# Patient Record
Sex: Female | Born: 1995 | Race: White | Hispanic: No | Marital: Single | Smoking: Former smoker
Health system: Southern US, Community
[De-identification: ages and names within clinical notes are randomized; demographics above are authoritative.]

---
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
## 2018-09-09 ENCOUNTER — Ambulatory Visit (HOSPITAL_COMMUNITY)
Admission: EM | Admit: 2018-09-09 | Discharge: 2018-09-09 | Disposition: A | Discharge status: Home / Self Care | Payer: BLUE CROSS/BLUE SHIELD | Attending: Family Medicine | Admitting: Family Medicine

## 2018-09-09 ENCOUNTER — Encounter (HOSPITAL_COMMUNITY): Payer: Self-pay | Admitting: Emergency Medicine

## 2018-09-09 DIAGNOSIS — M25571 Pain in right ankle and joints of right foot: Secondary | ICD-10-CM | POA: Diagnosis not present

## 2018-09-09 MED ORDER — PREDNISONE 50 MG PO TABS: 50.0000 mg | ORAL_TABLET | Freq: Every day | ORAL | 0 refills | Status: AC

## 2018-09-09 MED ORDER — NAPROXEN 500 MG PO TABS: 500.0000 mg | ORAL_TABLET | Freq: Two times a day (BID) | ORAL | 0 refills | Status: AC

## 2018-09-09 NOTE — Discharge Instructions (Signed)
Begin prednisone daily with food for the next 5 days, take in the morning if you are able After completion of prednisone take Naprosyn twice daily as needed for joint aches and pains Ice and elevate ankle when swollen  Follow-up with primary care and rheumatology for further evaluation of your persistent joint pains.

## 2018-09-09 NOTE — ED Provider Notes (Signed)
[facility]    CSN: [id number] Arrival date & time: 09/09/18  1338     History   Chief Complaint Chief Complaint  Patient presents with  . Ankle Pain  . Knee Pain    HPI Andrea Buchanan is a 23 y.o. female no significant past medical history presenting today for evaluation of right ankle pain.  Patient states that over the past 6 months she has had stiffness and arthralgias in her shoulders as well as upper spine/neck.  More recently she has developed discomfort in her right knee as well as right ankle.  Her ankle has been the most problematic as she has a lot of pain with weightbearing.  Denies any injury, denies previous injury.  She notes that her uncle has psoriatic arthritis.  Denies any immediate family members with any type of arthritis or other autoimmune disorder.  She does feel the stiffness is worse first thing in the morning, and improves with movement.  She has been taking ibuprofen and Arthriten without relief.  Denies numbness or tingling.  Denies change in color or redness.  HPI  History reviewed. No pertinent past medical history.  There are no active problems to display for this patient.   History reviewed. No pertinent surgical history.  OB History   No obstetric history on file.      Home Medications    Prior to Admission medications   Medication Sig Start Date End Date Taking? Authorizing Provider  naproxen (NAPROSYN) 500 MG tablet Take 1 tablet (500 mg total) by mouth 2 (two) times daily. 09/09/18   Wieters, Hallie C, PA-C  predniSONE (DELTASONE) 50 MG tablet Take 1 tablet (50 mg total) by mouth daily for 5 days. 09/09/18 09/14/18  Wieters, Junius Creamer, PA-C    Family History Family History  Problem Relation Age of Onset  . Arthritis Maternal Uncle     Social History Social History   Tobacco Use  . Smoking status: Never Smoker  Substance Use Topics  . Alcohol use: Yes  . Drug use: Not on file     Allergies   Patient has no known  allergies.   Review of Systems Review of Systems  Constitutional: Negative for fatigue and fever.  Eyes: Negative for visual disturbance.  Respiratory: Negative for shortness of breath.   Cardiovascular: Negative for chest pain.  Gastrointestinal: Negative for abdominal pain, nausea and vomiting.  Musculoskeletal: Positive for arthralgias, gait problem and joint swelling.  Skin: Negative for color change, rash and wound.  Neurological: Negative for dizziness, weakness, light-headedness and headaches.     Physical Exam Triage Vital Signs ED Triage Vitals  Enc Vitals Group     BP 09/09/18 1415 (!) 157/100     Pulse Rate 09/09/18 1415 93     Resp 09/09/18 1415 16     Temp 09/09/18 1415 98.6 F (37 C)     Temp src --      SpO2 09/09/18 1415 100 %     Weight --      Height --      Head Circumference --      Peak Flow --      Pain Score 09/09/18 1416 4     Pain Loc --      Pain Edu? --      Excl. in GC? --    No data found.  Updated Vital Signs BP (!) 157/100   Pulse 93   Temp 98.6 F (37 C)   Resp 16  LMP 09/09/2018   SpO2 100%   Visual Acuity Right Eye Distance:   Left Eye Distance:   Bilateral Distance:    Right Eye Near:   Left Eye Near:    Bilateral Near:     Physical Exam Vitals signs and nursing note reviewed.  Constitutional:      Appearance: She is well-developed.     Comments: No acute distress  HENT:     Head: Normocephalic and atraumatic.     Nose: Nose normal.  Eyes:     Conjunctiva/sclera: Conjunctivae normal.  Neck:     Musculoskeletal: Neck supple.  Cardiovascular:     Rate and Rhythm: Normal rate.  Pulmonary:     Effort: Pulmonary effort is normal. No respiratory distress.  Abdominal:     General: There is no distension.  Musculoskeletal: Normal range of motion.     Comments: Right ankle: Mild swelling compared to left, no overlying erythema or discoloration, nontender to palpation about medial and lateral malleolus, Achilles as  well as throughout anterior aspect of ankle and to dorsum of foot, dorsalis pedis 2+, cap refill less than 2 seconds, sensation intact distally, full active range of motion of the ankle  Right knee: No overlying swelling discoloration, nontender over patella and medial lateral joint lines, nontender over tibial tubercle, full active range of motion of knee  Skin:    General: Skin is warm and dry.  Neurological:     Mental Status: She is alert and oriented to person, place, and time.      UC Treatments / Results  Labs (all labs ordered are listed, but only abnormal results are displayed) Labs Reviewed - No data to display  EKG None  Radiology No results found.  Procedures Procedures (including critical care time)  Medications Ordered in UC Medications - No data to display  Initial Impression / Assessment and Plan / UC Course  I have reviewed the triage vital signs and the nursing notes.  Pertinent labs & imaging results that were available during my care of the patient were reviewed by me and considered in my medical decision making (see chart for details).     Patient with arthralgias, given age and symptoms concerning for possible underlying autoimmune cause.  Recommending follow-up with rheumatology.  For today we will treat with 5 days of prednisone, followed by NSAIDs after completion of prednisone.  Ice and elevation.  Continue to wear ASO brace.Discussed strict return precautions. Patient verbalized understanding and is agreeable with plan.  Final Clinical Impressions(s) / UC Diagnoses   Final diagnoses:  Acute right ankle pain     Discharge Instructions     Begin prednisone daily with food for the next 5 days, take in the morning if you are able After completion of prednisone take Naprosyn twice daily as needed for joint aches and pains Ice and elevate ankle when swollen  Follow-up with primary care and rheumatology for further evaluation of your persistent  joint pains.   ED Prescriptions    Medication Sig Dispense Auth. Provider   predniSONE (DELTASONE) 50 MG tablet Take 1 tablet (50 mg total) by mouth daily for 5 days. 5 tablet Wieters, Hallie C, PA-C   naproxen (NAPROSYN) 500 MG tablet Take 1 tablet (500 mg total) by mouth 2 (two) times daily. 30 tablet Wieters, Parnell C, PA-C     Controlled Substance Prescriptions [place] Controlled Substance Registry consulted? Not Applicable   Lew Dawes, [place] 09/09/18 1738

## 2018-09-09 NOTE — ED Triage Notes (Signed)
Pt states shes had chronic joint pain in her knee and ankles since dec of last year.

## 2018-11-11 ENCOUNTER — Encounter: Payer: Self-pay | Admitting: Internal Medicine

## 2018-11-11 ENCOUNTER — Other Ambulatory Visit: Payer: Self-pay

## 2018-11-11 ENCOUNTER — Ambulatory Visit (INDEPENDENT_AMBULATORY_CARE_PROVIDER_SITE_OTHER): Payer: BC Managed Care – PPO | Admitting: Internal Medicine

## 2018-11-11 DIAGNOSIS — M2550 Pain in unspecified joint: Secondary | ICD-10-CM

## 2018-11-11 NOTE — Progress Notes (Signed)
Virtual Visit via Telephone Note Due to current restrictions/limitations of in-office visits due to the COVID-19 pandemic, this scheduled clinical appointment was converted to a telehealth visit  I connected with Andrea Buchanan on 11/11/18 at 3:35 p.m by telephone and verified that I am speaking with the correct person using two identifiers. I am in my office.  The patient is at home.  Only the patient and myself participated in this encounter.   I discussed the limitations, risks, security and privacy concerns of performing an evaluation and management service by telephone and the availability of in person appointments. I also discussed with the patient that there may be a patient responsible charge related to this service. The patient expressed understanding and agreed to proceed.   History of Present Illness: Pt new to this practice.  Previous PCP was at [facility].  Last seen 8 yrs ago.  No chronic issues   C/o persistent jt pains in past few mths Jts include wrists, RT ankle and RT shoulder Notice swelling in RT ankle, not sure about the wrists + morning stiffness that last for about 1/2 hr.  Better once she gets moving.  No fever, wgh loss or unexplained rash. Hx of psoriatic arthritis in uncle and arthritis in grandparents. Seen at [facility] in May.  Placed on short course of Prednisone which she did not find helpful.  Also given Naprosyn which did not seem to help at first but now it takes.  She takes one every day for past several days.   Social History   Socioeconomic History  . Marital status: Single    Spouse name: Not on file  . Number of children: 0  . Years of education: Not on file  . Highest education level: Not on file  Occupational History  . Not on file  Social Needs  . Financial resource strain: Not on file  . Food insecurity    Worry: Not on file    Inability: Not on file  . Transportation needs    Medical: Not on file    Non-medical: Not on file   Tobacco Use  . Smoking status: Former Smoker  Substance and Sexual Activity  . Alcohol use: Yes  . Drug use: Yes    Types: Marijuana    Comment: occasionally  . Sexual activity: Not on file  Lifestyle  . Physical activity    Days per week: Not on file    Minutes per session: Not on file  . Stress: Not on file  Relationships  . Social Herbalist on phone: Not on file    Gets together: Not on file    Attends religious service: Not on file    Active member of club or organization: Not on file    Attends meetings of clubs or organizations: Not on file    Relationship status: Not on file  . Intimate partner violence    Fear of current or ex partner: Not on file    Emotionally abused: Not on file    Physically abused: Not on file    Forced sexual activity: Not on file  Other Topics Concern  . Not on file  Social History Narrative  . Not on file   Active Ambulatory Problems    Diagnosis Date Noted  . No Active Ambulatory Problems   Resolved Ambulatory Problems    Diagnosis Date Noted  . No Resolved Ambulatory Problems   No Additional Past Medical History   Patient has  a past family history of psoriasis headache arthritis paternal uncle and arthritis in her grandparents.  Past surgical history none  Observations/Objective: No direct observation done as this was a telephone encounter  Assessment and Plan: 1. Polyarthralgia -Differential diagnoses include OA or inflammatory arthritis.  However I think inflammatory arthritis given morning stiffness lasting only 30 minutes.  I recommend in person evaluation and then we can decide whether blood tests, imaging and referral to rheumatologist as needed.  Patient is agreeable to the plan.   Follow Up Instructions: 1 to 2 weeks in person   I discussed the assessment and treatment plan with the patient. The patient was provided an opportunity to ask questions and all were answered. The patient agreed with the plan and  demonstrated an understanding of the instructions.   The patient was advised to call back or seek an in-person evaluation if the symptoms worsen or if the condition fails to improve as anticipated.  I provided 14 minutes of non-face-to-face time during this encounter.   Jonah Blue, MD

## 2018-11-12 ENCOUNTER — Inpatient Hospital Stay: Payer: BLUE CROSS/BLUE SHIELD | Admitting: Nurse Practitioner

## 2018-11-17 ENCOUNTER — Telehealth: Payer: Self-pay

## 2018-11-17 NOTE — Telephone Encounter (Signed)
Called patient to do their pre-visit COVID screening.  Call went to voicemail. Unable to do prescreening.  

## 2018-11-18 ENCOUNTER — Other Ambulatory Visit: Payer: BC Managed Care – PPO

## 2018-11-18 ENCOUNTER — Other Ambulatory Visit: Payer: Self-pay

## 2018-11-18 ENCOUNTER — Ambulatory Visit (INDEPENDENT_AMBULATORY_CARE_PROVIDER_SITE_OTHER): Payer: BC Managed Care – PPO | Admitting: Internal Medicine

## 2018-11-18 ENCOUNTER — Ambulatory Visit (INDEPENDENT_AMBULATORY_CARE_PROVIDER_SITE_OTHER): Payer: BC Managed Care – PPO

## 2018-11-18 ENCOUNTER — Encounter: Payer: Self-pay | Admitting: Internal Medicine

## 2018-11-18 VITALS — BP 117/74 | HR 103 | Temp 97.5°F | Resp 17 | Ht 66.0 in | Wt 255.2 lb

## 2018-11-18 DIAGNOSIS — M79642 Pain in left hand: Secondary | ICD-10-CM

## 2018-11-18 DIAGNOSIS — M25532 Pain in left wrist: Secondary | ICD-10-CM

## 2018-11-18 DIAGNOSIS — M2550 Pain in unspecified joint: Secondary | ICD-10-CM

## 2018-11-18 DIAGNOSIS — M7989 Other specified soft tissue disorders: Secondary | ICD-10-CM | POA: Diagnosis not present

## 2018-11-18 DIAGNOSIS — R2241 Localized swelling, mass and lump, right lower limb: Secondary | ICD-10-CM

## 2018-11-18 DIAGNOSIS — M25531 Pain in right wrist: Secondary | ICD-10-CM | POA: Diagnosis not present

## 2018-11-18 DIAGNOSIS — M79641 Pain in right hand: Secondary | ICD-10-CM

## 2018-11-18 DIAGNOSIS — M25571 Pain in right ankle and joints of right foot: Secondary | ICD-10-CM

## 2018-11-18 NOTE — Progress Notes (Signed)
Patient ID: Andrea Buchanan, female    DOB: 03-Oct-1995  MRN: [id number]  CC: Knee Pain and Ankle Pain   Subjective: Andrea Buchanan is a 23 y.o. female who presents for in person evaluation of joint pains Her concerns today include:   I did a telephone visit with this patient last week.  On that visit planes of the following:  c/o persistent jt pains in past few mths Jts include wrists, RT ankle and RT shoulder Notice swelling in RT ankle, not sure about the wrists + morning stiffness that last for about 1/2 hr.  Better once she gets moving.  No fever, wgh loss or unexplained rash. Hx of psoriatic arthritis in uncle and arthritis in grandparents. Seen at [facility] in May.  Placed on short course of Prednisone which she did not find helpful.  Also given Naprosyn which did not seem to help at first but now it takes.  She takes one every day for past several days.   Today:  Wrists are still bothersome however they do not swell.  She also reports pain and stiffness over the knuckles.. RT ankle stays swollen.  No known injury to the ankle.  She has to limp sometimes he walks  RT shoulder pain more recent.  Can not sleep on it.  Hard to reach behind the back and over the head.  Still takes Naprosyn as needed.   Patient Active Problem List   Diagnosis Date Noted  . Polyarthralgia 11/11/2018     Current Outpatient Medications on File Prior to Visit  Medication Sig Dispense Refill  . naproxen (NAPROSYN) 500 MG tablet Take 1 tablet (500 mg total) by mouth 2 (two) times daily. 30 tablet 0   No current facility-administered medications on file prior to visit.     No Known Allergies  Social History   Socioeconomic History  . Marital status: Single    Spouse name: Not on file  . Number of children: 0  . Years of education: Not on file  . Highest education level: Not on file  Occupational History  . Not on file  Social Needs  . Financial resource strain: Not on file  . Food  insecurity    Worry: Not on file    Inability: Not on file  . Transportation needs    Medical: Not on file    Non-medical: Not on file  Tobacco Use  . Smoking status: Former Research scientist (life sciences)  . Smokeless tobacco: Never Used  Substance and Sexual Activity  . Alcohol use: Yes  . Drug use: Yes    Types: Marijuana    Comment: occasionally  . Sexual activity: Not on file  Lifestyle  . Physical activity    Days per week: Not on file    Minutes per session: Not on file  . Stress: Not on file  Relationships  . Social Herbalist on phone: Not on file    Gets together: Not on file    Attends religious service: Not on file    Active member of club or organization: Not on file    Attends meetings of clubs or organizations: Not on file    Relationship status: Not on file  . Intimate partner violence    Fear of current or ex partner: Not on file    Emotionally abused: Not on file    Physically abused: Not on file    Forced sexual activity: Not on file  Other Topics Concern  . Not  on file  Social History Narrative  . Not on file    Family History  Problem Relation Age of Onset  . Arthritis Maternal Uncle   . Diabetes Maternal Uncle   . Arthritis Maternal Grandmother   . Diabetes Maternal Grandmother   . Hyperlipidemia Maternal Grandmother   . Diabetes Maternal Grandfather   . Hyperlipidemia Maternal Grandfather     No past surgical history on file.  ROS: Review of Systems Negative except as stated above  PHYSICAL EXAM: BP 117/74   Pulse (!) 103   Temp (!) 97.5 F (36.4 C) (Temporal)   Resp 17   Ht 5\' 6"  (1.676 m)   Wt 255 lb 3.2 oz (115.8 kg)   LMP 11/04/2018 (Approximate)   SpO2 98%   BMI 41.19 kg/m   Physical Exam  General appearance - alert, well appearing, young Caucasian female and in no distress Neck - supple, no significant adenopathy Chest - clear to auscultation, no wheezes, rales or rhonchi, symmetric air entry Heart - normal rate, regular rhythm,  normal S1, S2, no murmurs, rubs, clicks or gallops Musculoskeletal -right ankle: Mild edema.  No erythema.  Slightly warm to touch.  Good flexion extension.  Some discomfort with rotation.  No tenderness on palpation of the Achilles tendon. Hands: Mild pitting of the nailbeds on the right hand.  No signs of visible inspection of the PIP DIP and MCP joints.  However she has an excellent palpation over the MCP joints and discomfort with movement of these joints.  Mild edema of the wrists with discomfort with passive movement in all direction. Right shoulder: Tenderness discomfort with passive movement Cross the body over the head and behind the back Skin: Moderate inflammatory acne over the chin.  No rash seen over the cheeks.  ASSESSMENT AND PLAN: 1. Polyarthralgia -Patient reports morning stiffness that lasts 30 minutes.  This is not consistent with inflammatory arthritis however she does have involvement of proximal joints in the hands, involvement of the wrists and ankles that may suggest inflammatory arthritis.  We will do some baseline blood tests.  X-rays of some of the joints.  Refer to rheumatology for further evaluation.  Patient feels she is doing okay with just the Naprosyn so she will continue that for now as needed. - Rheumatoid factor - ANA w/Reflex if Positive - Sedimentation rate - Ambulatory referral to Rheumatology - DG Ankle Complete Right; Future - DG Hand Complete Left; Future - DG Hand Complete Right; Future - CYCLIC CITRUL PEPTIDE ANTIBODY, IGG/IGA     Patient was given the opportunity to ask questions.  Patient verbalized understanding of the plan and was able to repeat key elements of the plan.   No orders of the defined types were placed in this encounter.    Requested Prescriptions    No prescriptions requested or ordered in this encounter    No follow-ups on file.  11/06/2018, MD, FACP

## 2018-11-18 NOTE — Progress Notes (Signed)
Knee pain has mostly resolved. R ankle pain is still constant. Is walking with a limp. [place] moving [place] campus today & [place].

## 2018-11-19 ENCOUNTER — Telehealth: Payer: Self-pay | Admitting: Internal Medicine

## 2018-11-19 NOTE — Telephone Encounter (Signed)
Phone call placed to patient today.  I left a message on her voicemail letting her know that the x-rays of the hands and right ankle show findings that suggest that she may have some form of inflammatory arthritis like rheumatoid arthritis or juvenile rheumatoid arthritis.  I await the results of lab tests and will let her know the results when they are available.  We have already submitted a referral for her to see the rheumatologist.

## 2018-11-19 NOTE — Progress Notes (Unsigned)
This note was opened in error.

## 2018-11-20 LAB — RHEUMATOID FACTOR: Rhuematoid fact SerPl-aCnc: 198.3 IU/mL — ABNORMAL HIGH (ref 0.0–13.9)

## 2018-11-20 LAB — SEDIMENTATION RATE: Sed Rate: 44 mm/hr — ABNORMAL HIGH (ref 0–32)

## 2018-11-20 LAB — CYCLIC CITRUL PEPTIDE ANTIBODY, IGG/IGA: Cyclic Citrullin Peptide Ab: >250 units — ABNORMAL HIGH (ref 0–19)

## 2018-11-20 LAB — ANA W/REFLEX IF POSITIVE: Anti Nuclear Antibody (ANA): NEGATIVE

## 2018-11-20 NOTE — Progress Notes (Signed)
Patient notified of results & recommendations. Expressed understanding.

## 2018-12-16 DIAGNOSIS — M0579 Rheumatoid arthritis with rheumatoid factor of multiple sites without organ or systems involvement: Secondary | ICD-10-CM | POA: Diagnosis not present

## 2018-12-16 DIAGNOSIS — Z79899 Other long term (current) drug therapy: Secondary | ICD-10-CM | POA: Diagnosis not present

## 2018-12-16 DIAGNOSIS — Z6839 Body mass index (BMI) 39.0-39.9, adult: Secondary | ICD-10-CM | POA: Diagnosis not present

## 2018-12-16 DIAGNOSIS — R5382 Chronic fatigue, unspecified: Secondary | ICD-10-CM | POA: Diagnosis not present

## 2018-12-16 DIAGNOSIS — M2550 Pain in unspecified joint: Secondary | ICD-10-CM | POA: Diagnosis not present

## 2018-12-30 DIAGNOSIS — M2550 Pain in unspecified joint: Secondary | ICD-10-CM | POA: Diagnosis not present

## 2018-12-30 DIAGNOSIS — M0579 Rheumatoid arthritis with rheumatoid factor of multiple sites without organ or systems involvement: Secondary | ICD-10-CM | POA: Diagnosis not present

## 2018-12-30 DIAGNOSIS — R5382 Chronic fatigue, unspecified: Secondary | ICD-10-CM | POA: Diagnosis not present

## 2019-03-03 DIAGNOSIS — M0579 Rheumatoid arthritis with rheumatoid factor of multiple sites without organ or systems involvement: Secondary | ICD-10-CM | POA: Diagnosis not present

## 2019-03-03 DIAGNOSIS — M2550 Pain in unspecified joint: Secondary | ICD-10-CM | POA: Diagnosis not present

## 2019-03-03 DIAGNOSIS — R5382 Chronic fatigue, unspecified: Secondary | ICD-10-CM | POA: Diagnosis not present

## 2019-04-07 DIAGNOSIS — M0579 Rheumatoid arthritis with rheumatoid factor of multiple sites without organ or systems involvement: Secondary | ICD-10-CM | POA: Diagnosis not present

## 2019-04-07 DIAGNOSIS — M25561 Pain in right knee: Secondary | ICD-10-CM | POA: Diagnosis not present

## 2019-04-07 DIAGNOSIS — E669 Obesity, unspecified: Secondary | ICD-10-CM | POA: Diagnosis not present

## 2019-04-07 DIAGNOSIS — M2550 Pain in unspecified joint: Secondary | ICD-10-CM | POA: Diagnosis not present

## 2019-04-07 DIAGNOSIS — R5382 Chronic fatigue, unspecified: Secondary | ICD-10-CM | POA: Diagnosis not present

## 2019-05-19 DIAGNOSIS — M0579 Rheumatoid arthritis with rheumatoid factor of multiple sites without organ or systems involvement: Secondary | ICD-10-CM | POA: Diagnosis not present

## 2019-05-19 DIAGNOSIS — M2550 Pain in unspecified joint: Secondary | ICD-10-CM | POA: Diagnosis not present

## 2019-05-19 DIAGNOSIS — R5382 Chronic fatigue, unspecified: Secondary | ICD-10-CM | POA: Diagnosis not present

## 2019-06-30 DIAGNOSIS — M25561 Pain in right knee: Secondary | ICD-10-CM | POA: Diagnosis not present

## 2019-06-30 DIAGNOSIS — R5382 Chronic fatigue, unspecified: Secondary | ICD-10-CM | POA: Diagnosis not present

## 2019-06-30 DIAGNOSIS — M0579 Rheumatoid arthritis with rheumatoid factor of multiple sites without organ or systems involvement: Secondary | ICD-10-CM | POA: Diagnosis not present

## 2019-06-30 DIAGNOSIS — M2550 Pain in unspecified joint: Secondary | ICD-10-CM | POA: Diagnosis not present

## 2019-08-18 DIAGNOSIS — M25561 Pain in right knee: Secondary | ICD-10-CM | POA: Diagnosis not present

## 2019-08-18 DIAGNOSIS — R5382 Chronic fatigue, unspecified: Secondary | ICD-10-CM | POA: Diagnosis not present

## 2019-08-18 DIAGNOSIS — M0579 Rheumatoid arthritis with rheumatoid factor of multiple sites without organ or systems involvement: Secondary | ICD-10-CM | POA: Diagnosis not present

## 2019-08-18 DIAGNOSIS — M2550 Pain in unspecified joint: Secondary | ICD-10-CM | POA: Diagnosis not present

## 2019-11-19 DIAGNOSIS — R5382 Chronic fatigue, unspecified: Secondary | ICD-10-CM | POA: Diagnosis not present

## 2019-11-19 DIAGNOSIS — M0579 Rheumatoid arthritis with rheumatoid factor of multiple sites without organ or systems involvement: Secondary | ICD-10-CM | POA: Diagnosis not present

## 2019-11-19 DIAGNOSIS — M2550 Pain in unspecified joint: Secondary | ICD-10-CM | POA: Diagnosis not present

## 2020-02-23 DIAGNOSIS — R5382 Chronic fatigue, unspecified: Secondary | ICD-10-CM | POA: Diagnosis not present

## 2020-02-23 DIAGNOSIS — Z111 Encounter for screening for respiratory tuberculosis: Secondary | ICD-10-CM | POA: Diagnosis not present

## 2020-02-23 DIAGNOSIS — M0579 Rheumatoid arthritis with rheumatoid factor of multiple sites without organ or systems involvement: Secondary | ICD-10-CM | POA: Diagnosis not present

## 2020-02-23 DIAGNOSIS — M2550 Pain in unspecified joint: Secondary | ICD-10-CM | POA: Diagnosis not present

## 2020-02-23 DIAGNOSIS — Z79899 Other long term (current) drug therapy: Secondary | ICD-10-CM | POA: Diagnosis not present

## 2020-04-28 DIAGNOSIS — Z1152 Encounter for screening for COVID-19: Secondary | ICD-10-CM | POA: Diagnosis not present

## 2020-05-26 DIAGNOSIS — M0579 Rheumatoid arthritis with rheumatoid factor of multiple sites without organ or systems involvement: Secondary | ICD-10-CM | POA: Diagnosis not present

## 2020-05-26 DIAGNOSIS — M79641 Pain in right hand: Secondary | ICD-10-CM | POA: Diagnosis not present

## 2020-05-26 DIAGNOSIS — M2550 Pain in unspecified joint: Secondary | ICD-10-CM | POA: Diagnosis not present

## 2020-05-26 DIAGNOSIS — M25531 Pain in right wrist: Secondary | ICD-10-CM | POA: Diagnosis not present

## 2020-05-26 DIAGNOSIS — M25532 Pain in left wrist: Secondary | ICD-10-CM | POA: Diagnosis not present

## 2020-05-26 DIAGNOSIS — M79642 Pain in left hand: Secondary | ICD-10-CM | POA: Diagnosis not present

## 2020-08-23 DIAGNOSIS — M2550 Pain in unspecified joint: Secondary | ICD-10-CM | POA: Diagnosis not present

## 2020-08-23 DIAGNOSIS — M0579 Rheumatoid arthritis with rheumatoid factor of multiple sites without organ or systems involvement: Secondary | ICD-10-CM | POA: Diagnosis not present

## 2020-08-23 DIAGNOSIS — M79642 Pain in left hand: Secondary | ICD-10-CM | POA: Diagnosis not present

## 2020-08-23 DIAGNOSIS — M79641 Pain in right hand: Secondary | ICD-10-CM | POA: Diagnosis not present

## 2020-08-23 IMAGING — DX RIGHT ANKLE - COMPLETE 3+ VIEW
3 series · 3 of 3 positions shown · non-contrast
Comparison: None.

CLINICAL DATA: Chronic pain and swelling of the right ankle

EXAM:
RIGHT ANKLE - COMPLETE 3+ VIEW

[ankle ap]
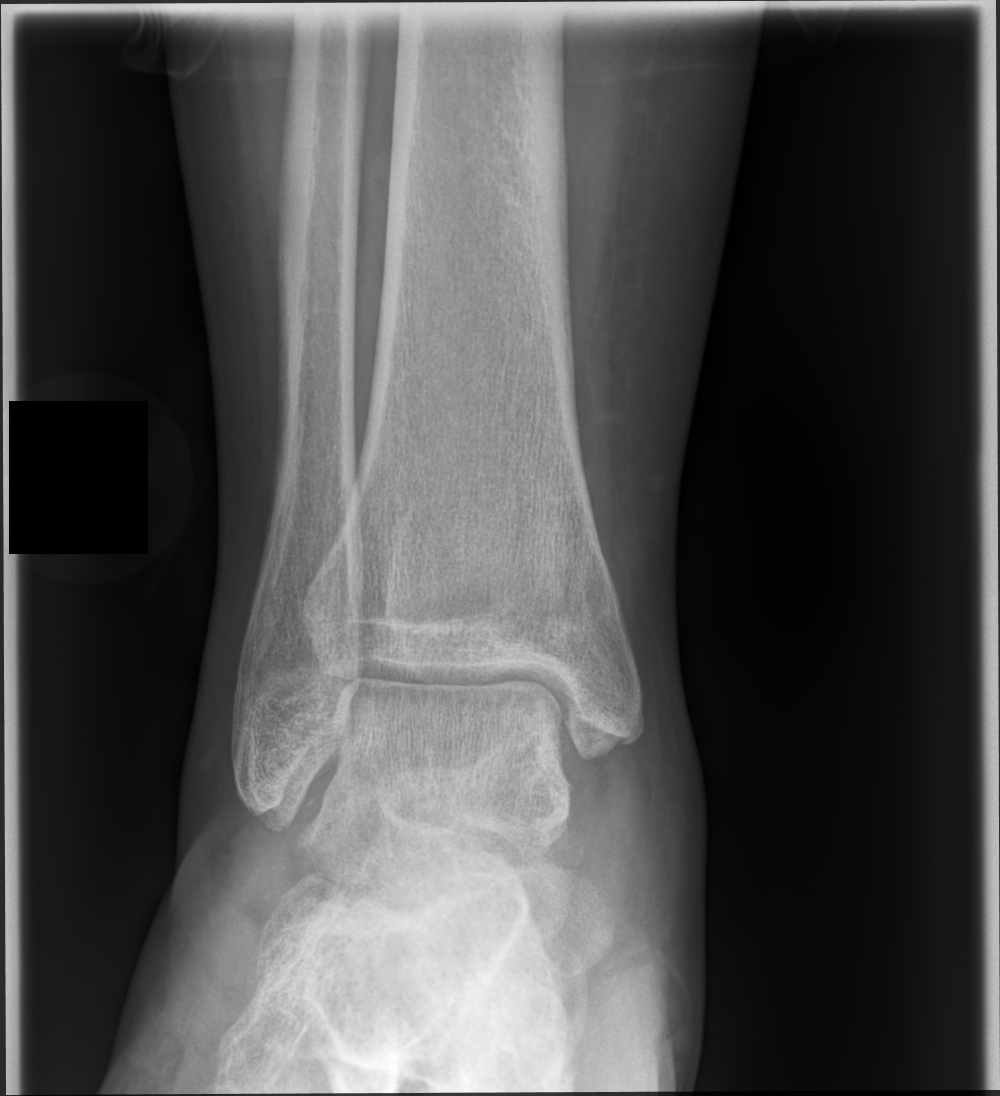

[ankle medial oblique]
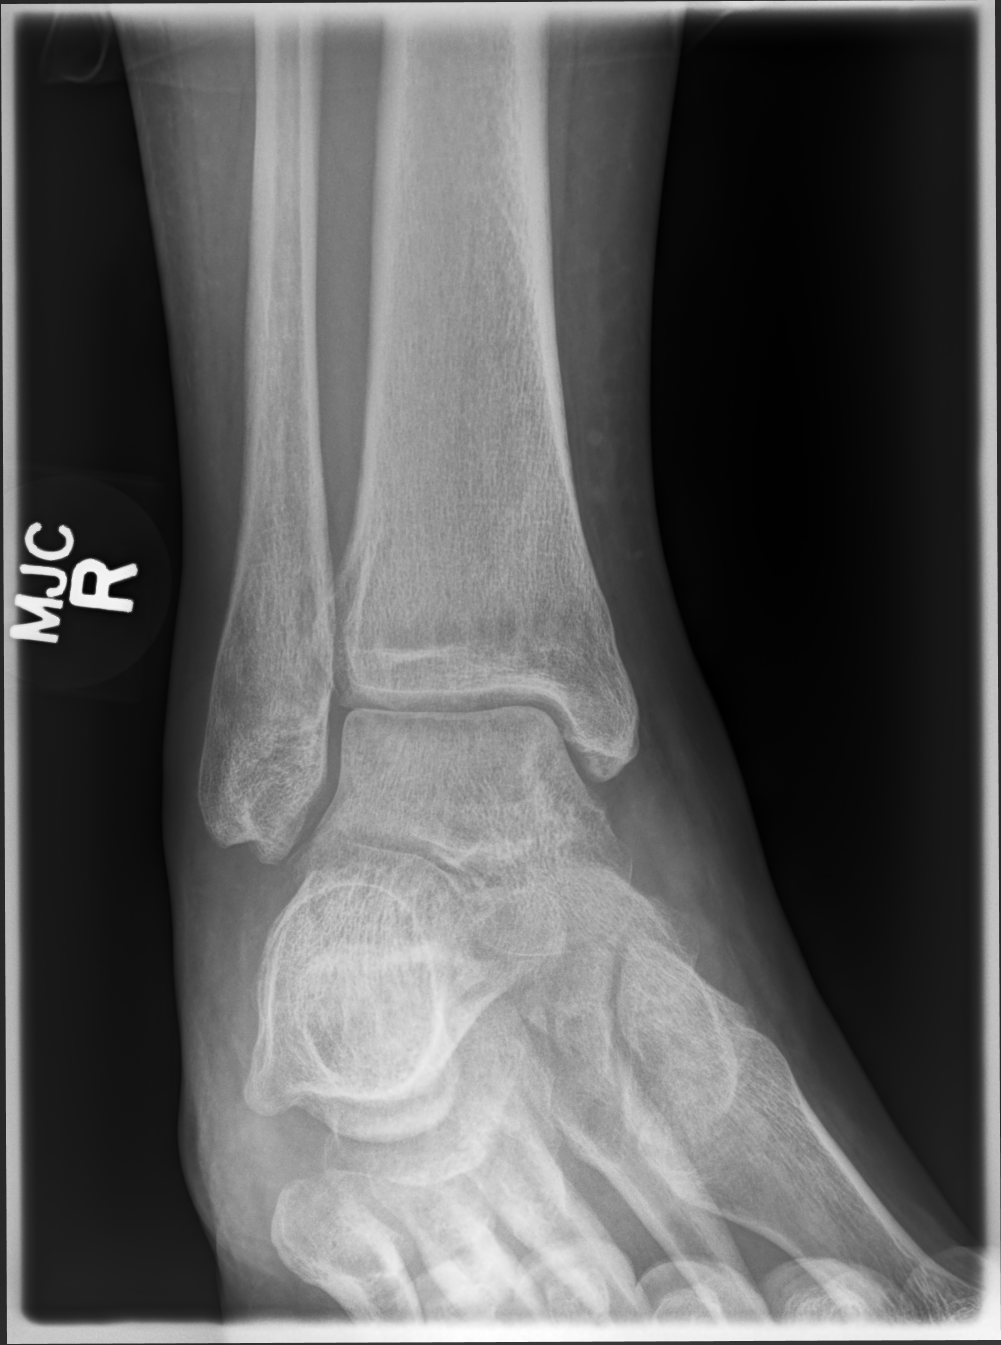

[ankle lat]
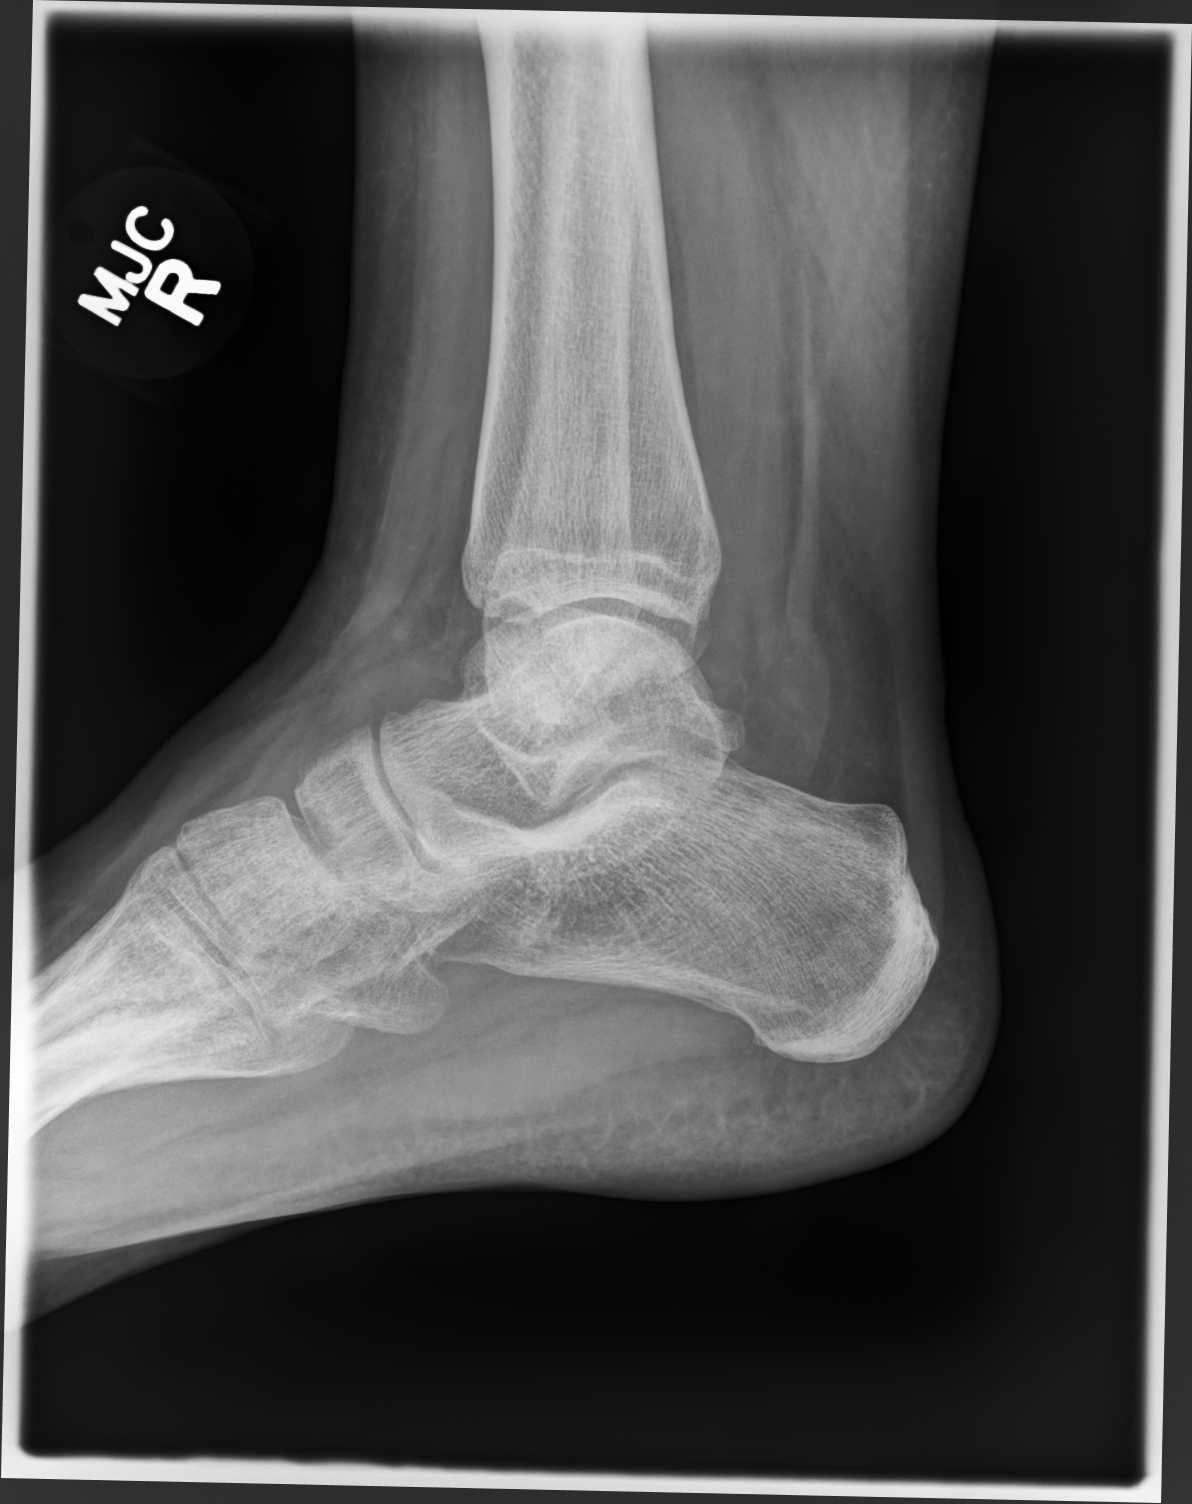

[3 of 3 positions shown; findings below may reference images not displayed]

FINDINGS: No fracture, malalignment or joint effusion is evident. There is
minimal soft tissue swelling. There is notable juxta-articular
osteopenia about the ankle with a relative lucency involving the
distal tibia and talar dome. Articular surfaces remain smooth. No
periarticular spurring. Ankle mortise is congruent.
IMPRESSION: Minimal soft tissue swelling with Juxta-articular osteopenia about
the ankle including relative lucency involving the distal tibia and
talar dome. Findings are nonspecific, but could be seen in the
setting of juvenile rheumatoid arthritis as well as other
seronegative spondyloarthropathy.

## 2020-11-29 DIAGNOSIS — M79641 Pain in right hand: Secondary | ICD-10-CM | POA: Diagnosis not present

## 2020-11-29 DIAGNOSIS — M79642 Pain in left hand: Secondary | ICD-10-CM | POA: Diagnosis not present

## 2020-11-29 DIAGNOSIS — M0579 Rheumatoid arthritis with rheumatoid factor of multiple sites without organ or systems involvement: Secondary | ICD-10-CM | POA: Diagnosis not present

## 2020-11-29 DIAGNOSIS — M2550 Pain in unspecified joint: Secondary | ICD-10-CM | POA: Diagnosis not present

## 2021-03-07 DIAGNOSIS — M2550 Pain in unspecified joint: Secondary | ICD-10-CM | POA: Diagnosis not present

## 2021-03-07 DIAGNOSIS — M79641 Pain in right hand: Secondary | ICD-10-CM | POA: Diagnosis not present

## 2021-03-07 DIAGNOSIS — M0579 Rheumatoid arthritis with rheumatoid factor of multiple sites without organ or systems involvement: Secondary | ICD-10-CM | POA: Diagnosis not present

## 2021-03-07 DIAGNOSIS — M79642 Pain in left hand: Secondary | ICD-10-CM | POA: Diagnosis not present

## 2021-03-07 DIAGNOSIS — M25532 Pain in left wrist: Secondary | ICD-10-CM | POA: Diagnosis not present

## 2021-03-07 DIAGNOSIS — M25531 Pain in right wrist: Secondary | ICD-10-CM | POA: Diagnosis not present

## 2021-03-22 ENCOUNTER — Other Ambulatory Visit: Payer: Self-pay | Admitting: Internal Medicine

## 2021-03-22 ENCOUNTER — Encounter: Payer: Self-pay | Admitting: Internal Medicine

## 2021-03-22 DIAGNOSIS — M069 Rheumatoid arthritis, unspecified: Secondary | ICD-10-CM | POA: Insufficient documentation

## 2021-03-22 MED ORDER — RINVOQ 15 MG PO TB24: 15.0000 mg | ORAL_TABLET | Freq: Every day | ORAL | Status: AC

## 2021-03-22 MED ORDER — HYDROXYCHLOROQUINE SULFATE 200 MG PO TABS: 200.0000 mg | ORAL_TABLET | Freq: Two times a day (BID) | ORAL | Status: AC

## 2021-10-11 ENCOUNTER — Other Ambulatory Visit: Payer: Self-pay | Admitting: Internal Medicine

## 2021-10-11 MED ORDER — SULFASALAZINE 500 MG PO TABS: 500.0000 mg | ORAL_TABLET | Freq: Two times a day (BID) | ORAL | 0 refills | Status: AC
